# Patient Record
Sex: Male | Born: 1990 | Hispanic: No | Marital: Single | Smoking: Never smoker
Health system: Southern US, Community
[De-identification: ages and names within clinical notes are randomized; demographics above are authoritative.]

## PROBLEM LIST (undated history)

## (undated) DIAGNOSIS — K219 Gastro-esophageal reflux disease without esophagitis: Secondary | ICD-10-CM

---
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
## 2009-10-12 IMAGING — CR DG CHEST 2V
2 series · 2 of 2 positions shown · non-contrast
Comparison: None

CLINICAL DATA: Headache, chest pain fever, shortness of breath

CHEST - 2 VIEW

[w chest pa]
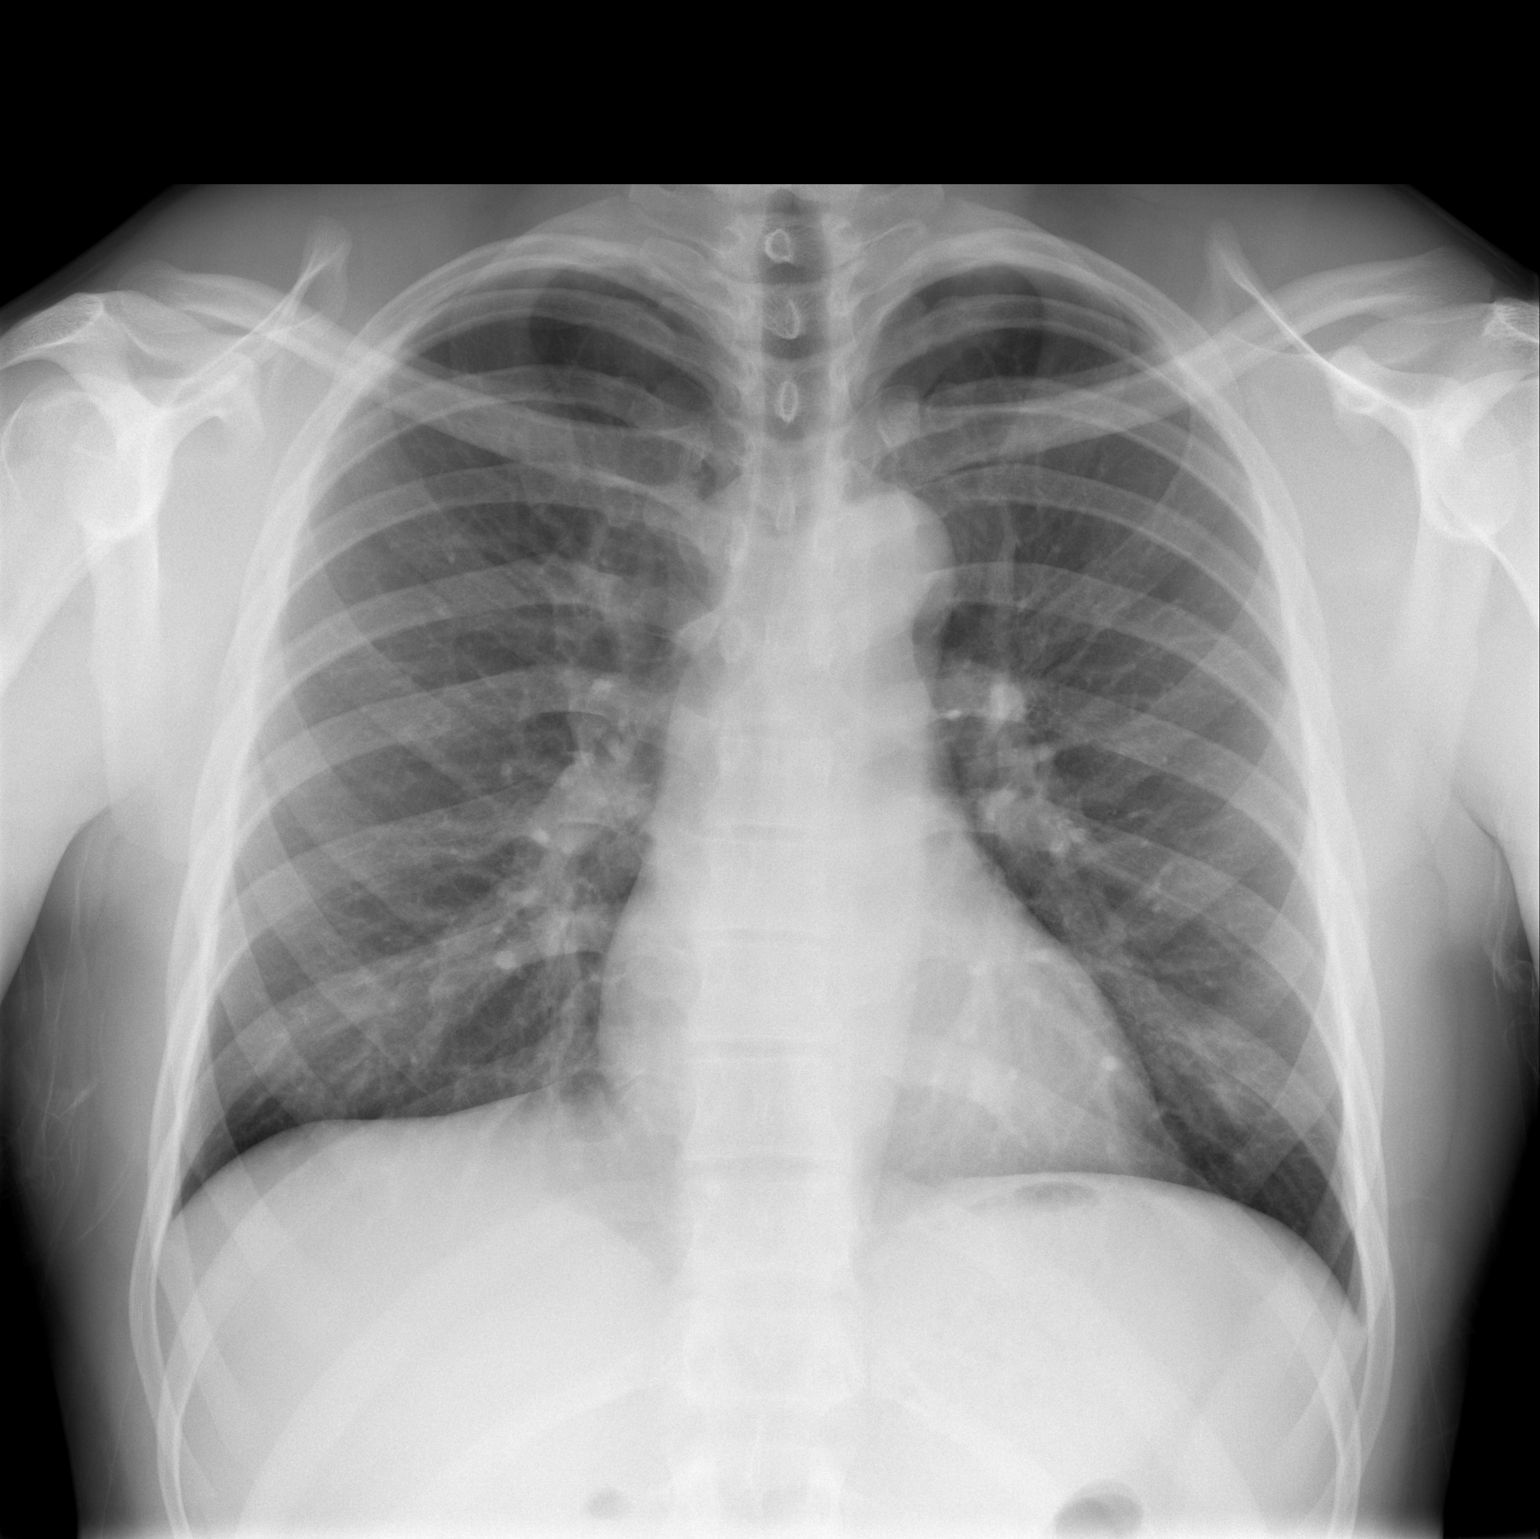

[w chest lat]
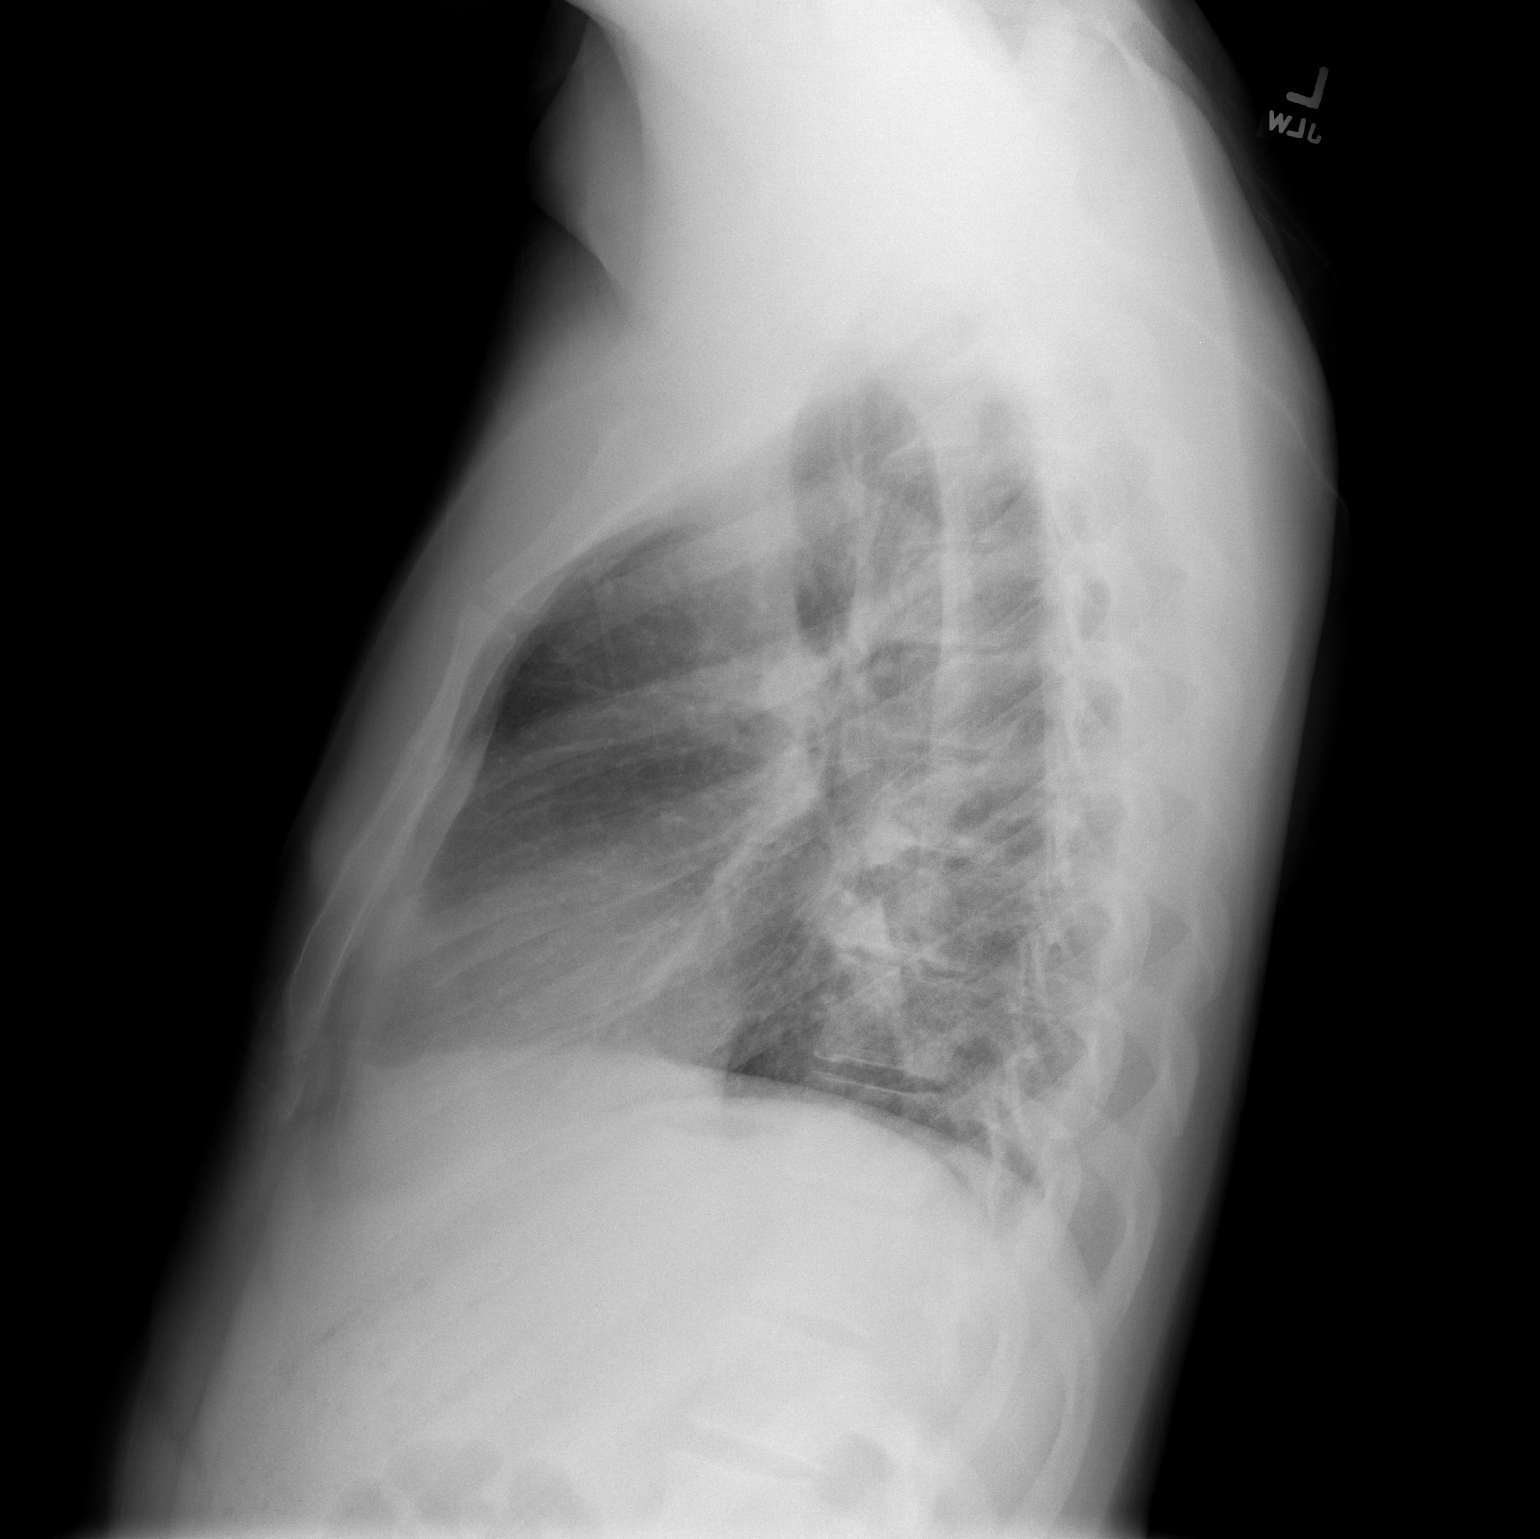

[2 of 2 positions shown; findings below may reference images not displayed]

FINDINGS: Normal heart size, mediastinal contours, and pulmonary vascularity.
Lungs clear.
No pleural effusion or pneumothorax.
Bones unremarkable.
IMPRESSION: No acute abnormalities.

## 2010-03-01 ENCOUNTER — Emergency Department (HOSPITAL_BASED_OUTPATIENT_CLINIC_OR_DEPARTMENT_OTHER)
Admission: EM | Admit: 2010-03-01 | Discharge: 2010-03-01 | Disposition: A | Discharge status: Home / Self Care | Payer: Self-pay | Attending: Emergency Medicine | Admitting: Emergency Medicine

## 2010-03-01 DIAGNOSIS — R350 Frequency of micturition: Secondary | ICD-10-CM | POA: Insufficient documentation

## 2010-03-01 LAB — URINALYSIS, ROUTINE W REFLEX MICROSCOPIC
Nitrite: NEGATIVE
Protein, ur: NEGATIVE mg/dL
Specific Gravity, Urine: 1.026 (ref 1.005–1.030)
Urobilinogen, UA: 0.2 mg/dL (ref 0.0–1.0)

## 2010-10-14 LAB — COMPREHENSIVE METABOLIC PANEL
ALT: 16
AST: 30
Albumin: 4.7
Chloride: 103
Creatinine, Ser: 0.9
GFR calc Af Amer: >60
Sodium: 141
Total Bilirubin: 0.5

## 2010-10-14 LAB — CBC
MCV: 81.3
Platelets: 146 — ABNORMAL LOW
RBC: 5.98 — ABNORMAL HIGH
WBC: 18.5 — ABNORMAL HIGH

## 2010-10-14 LAB — DIFFERENTIAL
Basophils Absolute: 0.2 — ABNORMAL HIGH
Eosinophils Relative: 0
Lymphs Abs: 1.1
Monocytes Absolute: 1.5 — ABNORMAL HIGH
Monocytes Relative: 8
Neutro Abs: 15.7 — ABNORMAL HIGH

## 2012-07-13 ENCOUNTER — Encounter (HOSPITAL_BASED_OUTPATIENT_CLINIC_OR_DEPARTMENT_OTHER): Payer: Self-pay | Admitting: Emergency Medicine

## 2012-07-13 ENCOUNTER — Emergency Department (HOSPITAL_BASED_OUTPATIENT_CLINIC_OR_DEPARTMENT_OTHER)
Admission: EM | Admit: 2012-07-13 | Discharge: 2012-07-13 | Disposition: A | Discharge status: Home / Self Care | Payer: No Typology Code available for payment source | Attending: Emergency Medicine | Admitting: Emergency Medicine

## 2012-07-13 DIAGNOSIS — R05 Cough: Secondary | ICD-10-CM | POA: Insufficient documentation

## 2012-07-13 DIAGNOSIS — J069 Acute upper respiratory infection, unspecified: Secondary | ICD-10-CM | POA: Insufficient documentation

## 2012-07-13 DIAGNOSIS — R51 Headache: Secondary | ICD-10-CM | POA: Insufficient documentation

## 2012-07-13 DIAGNOSIS — B349 Viral infection, unspecified: Secondary | ICD-10-CM

## 2012-07-13 DIAGNOSIS — R509 Fever, unspecified: Secondary | ICD-10-CM | POA: Insufficient documentation

## 2012-07-13 DIAGNOSIS — R1110 Vomiting, unspecified: Secondary | ICD-10-CM | POA: Insufficient documentation

## 2012-07-13 DIAGNOSIS — B9789 Other viral agents as the cause of diseases classified elsewhere: Secondary | ICD-10-CM | POA: Insufficient documentation

## 2012-07-13 DIAGNOSIS — R059 Cough, unspecified: Secondary | ICD-10-CM | POA: Insufficient documentation

## 2012-07-13 LAB — RAPID STREP SCREEN (MED CTR MEBANE ONLY): Streptococcus, Group A Screen (Direct): NEGATIVE

## 2012-07-13 MED ORDER — ONDANSETRON 8 MG PO TBDP
8.0000 mg | ORAL_TABLET | Freq: Once | ORAL | Status: AC
  Administered 2012-07-13: 8 mg via ORAL
  Filled 2012-07-13: qty 1

## 2012-07-13 MED ORDER — OXYMETAZOLINE HCL 0.05 % NA SOLN
1.0000 | Freq: Once | NASAL | Status: AC
  Administered 2012-07-13: 1 via NASAL
  Filled 2012-07-13: qty 15

## 2012-07-13 MED ORDER — IBUPROFEN 800 MG PO TABS
800.0000 mg | ORAL_TABLET | Freq: Once | ORAL | Status: AC
  Administered 2012-07-13: 800 mg via ORAL
  Filled 2012-07-13: qty 1

## 2012-07-13 NOTE — ED Notes (Signed)
Pt c/o flu-like symptoms including right ear pain, sore throat, vomiting and headache.

## 2012-07-14 NOTE — ED Provider Notes (Signed)
   History    CSN: [id number] Arrival date & time 07/13/12  3086  First MD Initiated Contact with Patient 07/13/12 [phone]     Chief Complaint  Patient presents with  . Emesis  . Sore Throat   (Consider location/radiation/quality/duration/timing/severity/associated sxs/prior Treatment) Patient is a 22 y.o. male presenting with vomiting and pharyngitis. The history is provided by the patient.  Emesis Severity:  Mild Timing:  Intermittent Number of daily episodes:  1 Quality:  Undigested food Able to tolerate:  Solids Progression:  Improving Chronicity:  New Recent urination:  Normal Context: post-tussive   Relieved by:  Nothing Worsened by:  Nothing tried Ineffective treatments:  None tried Associated symptoms: cough, fever, headaches, sore throat and URI   Cough:    Cough characteristics:  Non-productive   Sputum characteristics:  Nondescript   Severity:  Mild   Onset quality:  Gradual   Timing:  Sporadic   Progression:  Unchanged   Chronicity:  New Sore throat:    Severity:  Mild   Onset quality:  Gradual   Timing:  Constant   Progression:  Unchanged Sore Throat Associated symptoms include headaches.     History reviewed. No pertinent past medical history. History reviewed. No pertinent past surgical history. No family history on file. History  Substance Use Topics  . Smoking status: Never Smoker   . Smokeless tobacco: Not on file  . Alcohol Use: No    Review of Systems  HENT: Positive for sore throat.   Gastrointestinal: Positive for vomiting.  Neurological: Positive for headaches.  All other systems reviewed and are negative.    Allergies  Review of patient's allergies indicates no known allergies.  Home Medications  No current outpatient prescriptions on file. BP 123/69  Pulse 82  Temp(Src) 98.8 F (37.1 C) (Oral)  Resp 20  Ht 6\' 7"  (2.007 m)  Wt 235 lb (106.595 kg)  BMI 26.46 kg/m2  SpO2 98% Physical Exam  Nursing note and vitals  reviewed. Constitutional: He is oriented to person, place, and time. He appears well-developed and well-nourished.  HENT:  Head: Normocephalic and atraumatic.  Right Ear: External ear normal.  Left Ear: External ear normal.  Nose: Nose normal.  Mouth/Throat: Oropharynx is clear and moist.  Eyes: Conjunctivae and EOM are normal. Pupils are equal, round, and reactive to light.  Neck: Normal range of motion. Neck supple.  Cardiovascular: Normal rate, regular rhythm, normal heart sounds and intact distal pulses.   Pulmonary/Chest: Effort normal and breath sounds normal. No respiratory distress. He has no wheezes. He exhibits no tenderness.  Abdominal: Soft. Bowel sounds are normal. He exhibits no distension and no mass. There is no tenderness. There is no guarding.  Musculoskeletal: Normal range of motion.  Neurological: He is alert and oriented to person, place, and time. He has normal reflexes. He exhibits normal muscle tone. Coordination normal.  Skin: Skin is warm and dry.  Psychiatric: He has a normal mood and affect. His behavior is normal. Judgment and thought content normal.    ED Course  Procedures (including critical care time) Labs Reviewed  RAPID STREP SCREEN  CULTURE, GROUP A STREP   No results found. 1. Viral syndrome     MDM    Hilario Quarry, MD 07/14/12 2205

## 2012-07-15 LAB — CULTURE, GROUP A STREP

## 2012-12-02 ENCOUNTER — Ambulatory Visit (INDEPENDENT_AMBULATORY_CARE_PROVIDER_SITE_OTHER): Payer: PRIVATE HEALTH INSURANCE | Admitting: Physician Assistant

## 2012-12-02 VITALS — BP 112/82 | HR 66 | Temp 97.5°F | Resp 18 | Ht 77.0 in | Wt 240.0 lb

## 2012-12-02 DIAGNOSIS — Z7251 High risk heterosexual behavior: Secondary | ICD-10-CM

## 2012-12-02 DIAGNOSIS — R350 Frequency of micturition: Secondary | ICD-10-CM

## 2012-12-02 LAB — POCT URINALYSIS DIPSTICK
Nitrite, UA: NEGATIVE
Urobilinogen, UA: 0.2
pH, UA: 7

## 2012-12-02 LAB — RPR

## 2012-12-02 LAB — POCT UA - MICROSCOPIC ONLY
Casts, Ur, LPF, POC: NEGATIVE
Crystals, Ur, HPF, POC: NEGATIVE
Yeast, UA: NEGATIVE

## 2012-12-02 LAB — HIV ANTIBODY (ROUTINE TESTING W REFLEX): HIV: NONREACTIVE

## 2012-12-02 LAB — HEPATITIS C ANTIBODY: HCV Ab: NEGATIVE

## 2012-12-02 MED ORDER — CEFTRIAXONE SODIUM 1 G IJ SOLR
250.0000 mg | INTRAMUSCULAR | Status: AC
  Administered 2012-12-02: 250 mg via INTRAMUSCULAR

## 2012-12-02 MED ORDER — AZITHROMYCIN 250 MG PO TABS: ORAL_TABLET | ORAL | Status: AC

## 2012-12-02 NOTE — Progress Notes (Signed)
Patient ID: Kenneth [name] MRN: [id number], DOB: Mar 08, 1990, 22 y.o. Date of Encounter: 12/02/2012, 1:49 PM  Primary Physician: No primary provider on file.  Chief Complaint: STD screening  HPI: 22 y.o. male with history below presents for STD screening. Patient states that his ex-girlfriend recently tested positive for gonorrhea 2 weeks ago. They had been dating for about 1.5 years and recently broke up several weeks ago. They have been working on things and are trying to bet back together. They did have sexual intercourse with a condom 2 weeks ago. He complains urinary frequency, urgency, mild bilateral low back pain, abdominal fullness, and occasional clear penile discharge. Afebrile. No chills. No nausea or vomiting. No history of STDs.   Patient states that he was faithful during the relationship and during their separation. He states that his girlfriend states that she was as well. He was worked up for [employer] about 4 weeks ago and tested negative for everything except his "screening hepatitis C test." His confirmatory hepatitis C test came back as negative.   He denies any rashes, blisters, sores, or lesions. Generally healthy. Going to [employer]. Studying speech pathology. Considering graduate school. Originally from the [place]. His father still lives out there.     History reviewed. No pertinent past medical history.   Home Meds: Prior to Admission medications   Medication Sig Start Date End Date Taking? Authorizing Provider  azithromycin (ZITHROMAX) 250 MG tablet Take 4 tabs po now 12/02/12   Sondra Barges, PA-C    Allergies: No Known Allergies  History   Social History  . Marital Status: Single    Spouse Name: N/A    Number of Children: N/A  . Years of Education: N/A   Occupational History  . Not on file.   Social History Main Topics  . Smoking status: Never Smoker   . Smokeless tobacco: Not on file  . Alcohol Use: No  . Drug Use: No  . Sexual Activity: Yes   Partners: Female    Birth Control/ Protection: Condom   Other Topics Concern  . Not on file   Social History Narrative  . No narrative on file     Review of Systems: Constitutional: negative for chills, fever, or fatigue  HEENT: negative for vision changes or hearing loss Abdominal: positive for abdominal pain. negative for nausea, vomiting, or diarrhea Genitourinary: see above Dermatological: negative for rash Neurologic: negative for headache   Physical Exam: Blood pressure 112/82, pulse 66, temperature 97.5 F (36.4 C), temperature source Oral, resp. rate 18, height 6\' 5"  (1.956 m), weight 240 lb (108.863 kg), SpO2 99.00%., Body mass index is 28.45 kg/(m^2). General: Well developed, well nourished, in no acute distress. Head: Normocephalic, atraumatic, eyes without discharge, sclera non-icteric, nares are without discharge. Bilateral auditory canals clear, TM's are without perforation, pearly grey and translucent with reflective cone of light bilaterally. Oral cavity moist, posterior pharynx without exudate, erythema, peritonsillar abscess, or post nasal drip.  Neck: Supple. No thyromegaly. Full ROM. No lymphadenopathy. Lungs: Clear bilaterally to auscultation without wheezes, rales, or rhonchi. Breathing is unlabored. Heart: RRR with S1 S2. No murmurs, rubs, or gallops appreciated. Abdomen: Soft, non-tender, non-distended with normoactive bowel sounds. No hepatosplenomegaly. No rebound/guarding. No obvious abdominal masses. Palpation over the bladder creates sensation to void. Mild bilateral CVA TTP.  Genital: No lesions, blisters, sores, or chancres. No discharge from the urethra. No TTP of the penis. No testicular TTP. No hernia.   Msk:  Strength and tone normal  for age. Extremities/Skin: Warm and dry. No clubbing or cyanosis. No edema. No rashes or suspicious lesions. Neuro: Alert and oriented X 3. Moves all extremities spontaneously. Gait is normal. CNII-XII grossly in  tact. Psych:  Responds to questions appropriately with a normal affect. Mild stuttering.     Labs: Results for orders placed in visit on 12/02/12  POCT URINALYSIS DIPSTICK      Result Value Range   Color, UA yellow     Clarity, UA clear     Glucose, UA neg     Bilirubin, UA neg     Ketones, UA neg     Spec Grav, UA 1.020     Blood, UA tr-intact     pH, UA 7.0     Protein, UA neg     Urobilinogen, UA 0.2     Nitrite, UA neg     Leukocytes, UA Negative    POCT UA - MICROSCOPIC ONLY      Result Value Range   WBC, Ur, HPF, POC neg     RBC, urine, microscopic neg     Bacteria, U Microscopic neg     Mucus, UA neg     Epithelial cells, urine per micros neg     Crystals, Ur, HPF, POC neg     Casts, Ur, LPF, POC neg     Yeast, UA neg      HIV, RPR, HSVI&II, uriprobe, hepatitis B Ab, hepatitis B Ag, hepatitis C Ab, urine culture all pending  ASSESSMENT AND PLAN:  22 y.o. male with urinary frequency, STD exposure, and high risk sex -Rocephin 250 mg IM -Azithromycin 250 mg Take all 4 tabs now #4 no RF -Await labs -Safe sex practices -RTC prn  Signed, Eula Listen, PA-C [facility] [place], [place] [address] [phone] 12/02/2012 1:49 PM

## 2012-12-03 LAB — GC/CHLAMYDIA PROBE AMP
CT Probe RNA: NEGATIVE
GC Probe RNA: NEGATIVE

## 2014-03-31 ENCOUNTER — Ambulatory Visit: Payer: Self-pay | Admitting: Surgery

## 2014-03-31 NOTE — H&P (Signed)
History of Present Illness Kenneth [name]. Kenneth [name]; 03/31/2014 11:34 AM) Patient words: lipoma left side.  The patient is a 24 year old male who presents with a complaint of mass. Referred by Dr. Leighton Ruff for evaluation of palpable subcutaneous masses.   This is a 24 year old male in good health who presents with at least a year history of a palpable mass in his anterior right thigh just below his groin. There is some mild associated discomfort. The patient cannot tell whether this area has enlarged much since he first discovered it. Recently he discovered 2 other smaller subcutaneous masses on the left side of his abdomen. He is concerned about these, so he presents now to discuss excision.   The patient is in school and also works several other jobs. He gets very little sleep. 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 Other Problems Kenneth [name], Kenneth [name]; 03/31/2014 10:21 AM) Gastroesophageal Reflux Disease  Past Surgical History Kenneth [name], [name]; 03/31/2014 10:21 AM) No pertinent past surgical history  Diagnostic Studies History Kenneth [name], [name]; 03/31/2014 10:21 AM) Colonoscopy never  Allergies (Kenneth [name], [name]; 03/31/2014 10:22 AM) No Known Drug Allergies 03/31/2014  Medication History (Kenneth [name], [name]; 03/31/2014 10:22 AM) PriLOSEC OTC (20MG  Tablet DR, Oral) Active. Medications Reconciled  Social History Kenneth [name], [name]; 03/31/2014 10:21 AM) Alcohol use Occasional alcohol use.  Family History Kenneth [name], Kenneth [name]; 03/31/2014 10:21 AM) First Degree Relatives No pertinent family history     Review of Systems Kenneth [name]; 03/31/2014 10:21 AM) General Not Present- Appetite Loss, Chills, Fatigue, Fever, Night Sweats, Weight Gain and Weight Loss. Skin Not Present- Change in Wart/Mole, Dryness, Hives, Jaundice, New Lesions, Non-Healing Wounds, Rash and Ulcer. HEENT Present- Seasonal Allergies. Not Present- Earache,  Hearing Loss, Hoarseness, Nose Bleed, Oral Ulcers, Ringing in the Ears, Sinus Pain, Sore Throat, Visual Disturbances, Wears glasses/contact lenses and Yellow Eyes. Respiratory Not Present- Bloody sputum, Chronic Cough, Difficulty Breathing, Snoring and Wheezing. Cardiovascular Not Present- Chest Pain, Difficulty Breathing Lying Down, Leg Cramps, Palpitations, Rapid Heart Rate, Shortness of Breath and Swelling of Extremities. Gastrointestinal Not Present- Abdominal Pain, Bloating, Bloody Stool, Change in Bowel Habits, Chronic diarrhea, Constipation, Difficulty Swallowing, Excessive gas, Gets full quickly at meals, Hemorrhoids, Indigestion, Nausea, Rectal Pain and Vomiting. Male Genitourinary Not Present- Blood in Urine, Change in Urinary Stream, Frequency, Impotence, Nocturia, Painful Urination, Urgency and Urine Leakage. Musculoskeletal Not Present- Back Pain, Joint Pain, Joint Stiffness, Muscle Pain, Muscle Weakness and Swelling of Extremities. Neurological Not Present- Decreased Memory, Fainting, Headaches, Numbness, Seizures, Tingling, Tremor, Trouble walking and Weakness. Psychiatric Present- Anxiety. Not Present- Bipolar, Change in Sleep Pattern, Depression, Fearful and Frequent crying. Hematology Not Present- Easy Bruising, Excessive bleeding, Gland problems, HIV and Persistent Infections.  Vitals (Kenneth [name]; 03/31/2014 10:21 AM) 03/31/2014 10:21 AM Weight: 244.8 lb Height: 72in Body Surface Area: 2.38 m Body Mass Index: 33.2 kg/m Temp.: 98.107F(Temporal)  Pulse: 67 (Regular)  BP: 134/76 (Sitting, Left Arm, Standard)     Physical Exam Kenneth [name]; 03/31/2014 11:32 AM)  The physical exam findings are as follows: Note:WDWN in NAD HEENT: EOMI, sclera anicteric Neck: No masses, no thyromegaly Lungs: CTA bilaterally; normal respiratory effort CV: Regular rate and rhythm; no murmurs Abd: +bowel sounds, soft, non-tender, two sub-cm palpable subcutaneous masses -  LLQ Anterior right thigh - palpable 2 cm subcutaneous mass; no overlying skin changes; mildly tender to palpation Ext: Well-perfused; no edema; unusually sweaty palms Skin: Warm, dry; no  sign of jaundice    Assessment & Plan Kenneth [name]. 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 The surgical procedure has been discussed with the patient. Potential risks, benefits, alternative treatments, and expected outcomes have been explained. All of the patient's questions at this time have been answered. The likelihood of reaching the patient's treatment goal is good. The patient understand the proposed surgical procedure and wishes to proceed.  The other areas probably also represent subcutaneous lipomas. Rather than removing all three, we have agree to just operate on the largest area. If the pathology shows only a benign lipoma, he will feel more comfortable about leaving them alone   Kenneth [name]. Kenneth Dover, [name], [facility] Surgery  General/ Trauma Surgery  03/31/2014 11:34 AM

## 2014-04-19 ENCOUNTER — Inpatient Hospital Stay (HOSPITAL_COMMUNITY)
Admission: RE | Admit: 2014-04-19 | Discharge: 2014-04-19 | Disposition: A | Discharge status: Home / Self Care | Payer: No Typology Code available for payment source | Source: Ambulatory Visit

## 2014-04-19 NOTE — Pre-Procedure Instructions (Signed)
Kenneth [name]  04/19/2014   Your procedure is scheduled on:  04-27-2014   Thursday   Report to [facility] Admitting at 1:30 PM    Call this number if you have problems the morning of surgery: [phone]   Remember:   Do not eat food or drink liquids after midnight.    Take these medicines the morning of surgery with A SIP OF WATER: none   Do not wear jewelry,   Do not wear lotions, powders, or perfumes.   Do not shave 48 hours prior to surgery. Men may shave face and neck.   Do not bring valuables to the hospital.  [facility] is not responsible  for any belongings or valuables.               Contacts, dentures or bridgework may not be worn into surgery.                   Patients discharged the day of surgery will not be allowed to drive home.   Name and phone number of your driver:     Special Instructions: See attached sheet for instructions on CHG shower   Please read over the following fact sheets that you were given: Pain Booklet and Surgical Site Infection Prevention

## 2014-04-20 ENCOUNTER — Encounter (HOSPITAL_COMMUNITY)
Admission: RE | Admit: 2014-04-20 | Discharge: 2014-04-20 | Disposition: A | Discharge status: Home / Self Care | Payer: 59 | Source: Ambulatory Visit | Attending: Surgery | Admitting: Surgery

## 2014-04-20 ENCOUNTER — Encounter (HOSPITAL_COMMUNITY): Payer: Self-pay

## 2014-04-20 DIAGNOSIS — Z01812 Encounter for preprocedural laboratory examination: Secondary | ICD-10-CM | POA: Diagnosis not present

## 2014-04-20 HISTORY — DX: Gastro-esophageal reflux disease without esophagitis: K21.9

## 2014-04-20 LAB — CBC
HCT: 49.4 % (ref 39.0–52.0)
Hemoglobin: 17 g/dL (ref 13.0–17.0)
MCH: 27.5 pg (ref 26.0–34.0)
MCHC: 34.4 g/dL (ref 30.0–36.0)
MCV: 79.9 fL (ref 78.0–100.0)
PLATELETS: 182 10*3/uL (ref 150–400)
RBC: 6.18 MIL/uL — ABNORMAL HIGH (ref 4.22–5.81)
RDW: 13.2 % (ref 11.5–15.5)
WBC: 7.5 10*3/uL (ref 4.0–10.5)

## 2014-04-20 NOTE — Progress Notes (Signed)
No cardiac problems.   Has had NO surgeries.   DA

## 2014-04-26 MED ORDER — CHLORHEXIDINE GLUCONATE 4 % EX LIQD
1.0000 "application " | Freq: Once | CUTANEOUS | Status: DC
  Filled 2014-04-26: qty 15

## 2014-04-26 MED ORDER — CEFAZOLIN SODIUM-DEXTROSE 2-3 GM-% IV SOLR
2.0000 g | INTRAVENOUS | Status: AC
  Administered 2014-04-27: 2 g via INTRAVENOUS
  Filled 2014-04-26: qty 50

## 2014-04-27 ENCOUNTER — Ambulatory Visit (HOSPITAL_COMMUNITY): Payer: 59 | Admitting: Anesthesiology

## 2014-04-27 ENCOUNTER — Ambulatory Visit (HOSPITAL_COMMUNITY)
Admission: RE | Admit: 2014-04-27 | Discharge: 2014-04-27 | Disposition: A | Discharge status: Home / Self Care | Payer: 59 | Source: Ambulatory Visit | Attending: Surgery | Admitting: Surgery

## 2014-04-27 ENCOUNTER — Encounter (HOSPITAL_COMMUNITY)
Admission: RE | Disposition: A | Discharge status: Home / Self Care | Payer: Self-pay | Source: Ambulatory Visit | Attending: Surgery

## 2014-04-27 ENCOUNTER — Encounter (HOSPITAL_COMMUNITY): Payer: Self-pay | Admitting: Surgery

## 2014-04-27 DIAGNOSIS — Z79899 Other long term (current) drug therapy: Secondary | ICD-10-CM | POA: Insufficient documentation

## 2014-04-27 DIAGNOSIS — D1723 Benign lipomatous neoplasm of skin and subcutaneous tissue of right leg: Secondary | ICD-10-CM | POA: Diagnosis present

## 2014-04-27 DIAGNOSIS — K219 Gastro-esophageal reflux disease without esophagitis: Secondary | ICD-10-CM | POA: Insufficient documentation

## 2014-04-27 HISTORY — PX: LIPOMA EXCISION: SHX5283

## 2014-04-27 SURGERY — EXCISION LIPOMA
Anesthesia: General | Site: Leg Lower | Laterality: Right

## 2014-04-27 MED ORDER — FENTANYL CITRATE 0.05 MG/ML IJ SOLN
INTRAMUSCULAR | Status: DC | PRN
  Administered 2014-04-27: 25 ug via INTRAVENOUS
  Administered 2014-04-27: 50 ug via INTRAVENOUS
  Administered 2014-04-27: 25 ug via INTRAVENOUS

## 2014-04-27 MED ORDER — HYDROCODONE-ACETAMINOPHEN 5-325 MG PO TABS: 1.0000 | ORAL_TABLET | ORAL | Status: DC | PRN

## 2014-04-27 MED ORDER — LACTATED RINGERS IV SOLN
INTRAVENOUS | Status: DC
  Administered 2014-04-27: 12:00:00 via INTRAVENOUS

## 2014-04-27 MED ORDER — MIDAZOLAM HCL 5 MG/5ML IJ SOLN
INTRAMUSCULAR | Status: DC | PRN
  Administered 2014-04-27 (×2): 1 mg via INTRAVENOUS

## 2014-04-27 MED ORDER — ONDANSETRON HCL 4 MG/2ML IJ SOLN: 4.0000 mg | INTRAMUSCULAR | Status: DC | PRN

## 2014-04-27 MED ORDER — FENTANYL CITRATE 0.05 MG/ML IJ SOLN
INTRAMUSCULAR | Status: AC
Start: 2014-04-27 — End: 2014-04-27
  Filled 2014-04-27: qty 5

## 2014-04-27 MED ORDER — LIDOCAINE HCL (CARDIAC) 20 MG/ML IV SOLN
INTRAVENOUS | Status: DC | PRN
  Administered 2014-04-27: 80 mg via INTRAVENOUS

## 2014-04-27 MED ORDER — LIDOCAINE HCL (CARDIAC) 20 MG/ML IV SOLN
INTRAVENOUS | Status: AC
  Filled 2014-04-27: qty 5

## 2014-04-27 MED ORDER — MIDAZOLAM HCL 2 MG/2ML IJ SOLN
INTRAMUSCULAR | Status: AC
  Filled 2014-04-27: qty 2

## 2014-04-27 MED ORDER — BUPIVACAINE-EPINEPHRINE 0.25% -1:200000 IJ SOLN
INTRAMUSCULAR | Status: DC | PRN
Start: 2014-04-27 — End: 2014-04-27
  Administered 2014-04-27: 10 mL

## 2014-04-27 MED ORDER — PROPOFOL 10 MG/ML IV BOLUS
INTRAVENOUS | Status: AC
  Filled 2014-04-27: qty 20

## 2014-04-27 MED ORDER — BUPIVACAINE-EPINEPHRINE (PF) 0.25% -1:200000 IJ SOLN
INTRAMUSCULAR | Status: AC
  Filled 2014-04-27: qty 30

## 2014-04-27 MED ORDER — PROPOFOL 10 MG/ML IV BOLUS
INTRAVENOUS | Status: DC | PRN
  Administered 2014-04-27: 200 mg via INTRAVENOUS

## 2014-04-27 MED ORDER — LACTATED RINGERS IV SOLN
INTRAVENOUS | Status: DC | PRN
  Administered 2014-04-27: 12:00:00 via INTRAVENOUS

## 2014-04-27 MED ORDER — HYDROCODONE-ACETAMINOPHEN 5-325 MG PO TABS: 1.0000 | ORAL_TABLET | ORAL | Status: AC | PRN

## 2014-04-27 MED ORDER — ONDANSETRON HCL 4 MG/2ML IJ SOLN
INTRAMUSCULAR | Status: DC | PRN
  Administered 2014-04-27: 4 mg via INTRAVENOUS

## 2014-04-27 MED ORDER — MORPHINE SULFATE 2 MG/ML IJ SOLN: 2.0000 mg | INTRAMUSCULAR | Status: DC | PRN

## 2014-04-27 MED ORDER — 0.9 % SODIUM CHLORIDE (POUR BTL) OPTIME
TOPICAL | Status: DC | PRN
  Administered 2014-04-27: 1000 mL

## 2014-04-27 SURGICAL SUPPLY — 41 items
BENZOIN TINCTURE PRP APPL 2/3 (GAUZE/BANDAGES/DRESSINGS) ×3 IMPLANT
BLADE SURG 10 STRL SS (BLADE) ×3 IMPLANT
BLADE SURG 15 STRL LF DISP TIS (BLADE) ×1 IMPLANT
BLADE SURG 15 STRL SS (BLADE) ×2
BLADE SURG ROTATE 9660 (MISCELLANEOUS) IMPLANT
CHLORAPREP W/TINT 26ML (MISCELLANEOUS) ×3 IMPLANT
CLOSURE WOUND 1/2 X4 (GAUZE/BANDAGES/DRESSINGS) ×1
COVER SURGICAL LIGHT HANDLE (MISCELLANEOUS) ×3 IMPLANT
DRAPE LAPAROTOMY T 98X78 PEDS (DRAPES) ×3 IMPLANT
DRAPE UTILITY XL STRL (DRAPES) ×3 IMPLANT
DRSG TEGADERM 4X4.75 (GAUZE/BANDAGES/DRESSINGS) ×3 IMPLANT
ELECT CAUTERY BLADE 6.4 (BLADE) ×3 IMPLANT
ELECT REM PT RETURN 9FT ADLT (ELECTROSURGICAL) ×3
ELECTRODE REM PT RTRN 9FT ADLT (ELECTROSURGICAL) ×1 IMPLANT
GAUZE SPONGE 2X2 8PLY STRL LF (GAUZE/BANDAGES/DRESSINGS) ×1 IMPLANT
GAUZE SPONGE 4X4 12PLY STRL (GAUZE/BANDAGES/DRESSINGS) ×3 IMPLANT
GLOVE BIO SURGEON STRL SZ7 (GLOVE) ×3 IMPLANT
GLOVE BIOGEL PI IND STRL 7.5 (GLOVE) ×1 IMPLANT
GLOVE BIOGEL PI INDICATOR 7.5 (GLOVE) ×2
GOWN STRL REUS W/ TWL LRG LVL3 (GOWN DISPOSABLE) ×2 IMPLANT
GOWN STRL REUS W/TWL LRG LVL3 (GOWN DISPOSABLE) ×4
KIT BASIN OR (CUSTOM PROCEDURE TRAY) ×3 IMPLANT
KIT ROOM TURNOVER OR (KITS) ×3 IMPLANT
NEEDLE HYPO 25GX1X1/2 BEV (NEEDLE) ×3 IMPLANT
NS IRRIG 1000ML POUR BTL (IV SOLUTION) ×3 IMPLANT
PACK SURGICAL SETUP 50X90 (CUSTOM PROCEDURE TRAY) ×3 IMPLANT
PAD ARMBOARD 7.5X6 YLW CONV (MISCELLANEOUS) ×3 IMPLANT
PENCIL BUTTON HOLSTER BLD 10FT (ELECTRODE) ×3 IMPLANT
SPECIMEN JAR SMALL (MISCELLANEOUS) ×3 IMPLANT
SPONGE GAUZE 2X2 STER 10/PKG (GAUZE/BANDAGES/DRESSINGS) ×2
SPONGE LAP 18X18 X RAY DECT (DISPOSABLE) ×3 IMPLANT
STRIP CLOSURE SKIN 1/2X4 (GAUZE/BANDAGES/DRESSINGS) ×2 IMPLANT
SUT MNCRL AB 4-0 PS2 18 (SUTURE) ×3 IMPLANT
SUT VIC AB 2-0 SH 27 (SUTURE)
SUT VIC AB 2-0 SH 27X BRD (SUTURE) IMPLANT
SUT VIC AB 3-0 SH 27 (SUTURE) ×2
SUT VIC AB 3-0 SH 27XBRD (SUTURE) ×1 IMPLANT
SYR BULB 3OZ (MISCELLANEOUS) ×3 IMPLANT
SYR CONTROL 10ML LL (SYRINGE) ×3 IMPLANT
TOWEL OR 17X24 6PK STRL BLUE (TOWEL DISPOSABLE) ×3 IMPLANT
TOWEL OR 17X26 10 PK STRL BLUE (TOWEL DISPOSABLE) ×3 IMPLANT

## 2014-04-27 NOTE — Anesthesia Procedure Notes (Signed)
Procedure Name: LMA Insertion Date/Time: 04/27/2014 12:07 PM Performed by: Susa Loffler Pre-anesthesia Checklist: Patient identified, Emergency Drugs available, Suction available, Patient being monitored and Timeout performed Patient Re-evaluated:Patient Re-evaluated prior to inductionOxygen Delivery Method: Circle system utilized Preoxygenation: Pre-oxygenation with 100% oxygen Intubation Type: IV induction LMA: LMA inserted LMA Size: 5.0 Number of attempts: 1 Placement Confirmation: positive ETCO2 and breath sounds checked- equal and bilateral Tube secured with: Tape Dental Injury: Teeth and Oropharynx as per pre-operative assessment

## 2014-04-27 NOTE — Op Note (Signed)
Pre-op Diagnosis:  Subcutaneous lipoma - right thigh 3 cm Post-op Diagnosis:  Same Procedure:  Excision of subcutaneous lipoma - right thigh 3 cm Surgeon:  Donnie Mesa K. Anesthesia:  Gen - LMA  Indications:  24 yo male presents with a firm mass enlarging in his lateral anterior right thigh.  He presents now for excision.  Procedure:  The patient was brought to the recovery room and placed in a supine position on the table.  After an adequate level of anesthesia was obtained, his right thigh was prepped with chloraprep and draped in sterile fashion.  Timeout was taken.  The area over the palpable mass was infiltrated with 0.25% Marcaine. A vertical incision was made and dissection was carried down to the surface of the mass.  I bluntly dissected around the mass and it was completely removed.  Hemostasis was obtained with cautery.  The wound was closed with 3-0 vicryl and 4-0 monocryl.  Steri-strips and dressings were applied.  He was extubated and brought to the recovery room in stable condition.  Counts were correct.  Imogene Burn. Georgette Dover, MD, [facility] Surgery  General/ Trauma Surgery  04/27/2014 12:31 PM

## 2014-04-27 NOTE — Anesthesia Postprocedure Evaluation (Signed)
  Anesthesia Post-op Note  Patient: Kenneth [name]  Procedure(s) Performed: Procedure(s): EXCISION OF LIPOMA RIGHT ANTERIOR THIGH (Right)  Patient Location: PACU  Anesthesia Type:General  Level of Consciousness: awake, alert  and oriented  Airway and Oxygen Therapy: Patient Spontanous Breathing  Post-op Pain: none  Post-op Assessment: Post-op Vital signs reviewed, Patient's Cardiovascular Status Stable, Respiratory Function Stable, Patent Airway, No signs of Nausea or vomiting and Pain level controlled  Post-op Vital Signs: Reviewed and stable  Last Vitals:  Filed Vitals:   04/27/14 1352  BP: 122/81  Pulse: 47  Temp:   Resp:     Complications: No apparent anesthesia complications

## 2014-04-27 NOTE — H&P (View-Only) (Signed)
History of Present Illness Kenneth [name]. Kenneth Friesenhahn MD; 03/31/2014 11:34 AM) Patient words: lipoma left side.  The patient is a 24 year old male who presents with a complaint of mass. Referred by Dr. Leighton Ruff for evaluation of palpable subcutaneous masses.   This is a 24 year old male in good health who presents with at least a year history of a palpable mass in his anterior right thigh just below his groin. There is some mild associated discomfort. The patient cannot tell whether this area has enlarged much since he first discovered it. Recently he discovered 2 other smaller subcutaneous masses on the left side of his abdomen. He is concerned about these, so he presents now to discuss excision.   The patient is in school and also works several other jobs. He gets very little sleep. 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 Other Problems Kenneth [name], Kenneth [name]; 03/31/2014 10:21 AM) Gastroesophageal Reflux Disease  Past Surgical History Kenneth [name], CMA; 03/31/2014 10:21 AM) No pertinent past surgical history  Diagnostic Studies History Kenneth [name], CMA; 03/31/2014 10:21 AM) Colonoscopy never  Allergies Kenneth [name], CMA; 03/31/2014 10:22 AM) No Known Drug Allergies 03/31/2014  Medication History (Kenneth [name], CMA; 03/31/2014 10:22 AM) PriLOSEC OTC (20MG  Tablet DR, Oral) Active. Medications Reconciled  Social History Kenneth [name], CMA; 03/31/2014 10:21 AM) Alcohol use Occasional alcohol use.  Family History Kenneth [name], [place]; 03/31/2014 10:21 AM) First Degree Relatives No pertinent family history     Review of Systems Kenneth [name] CMA; 03/31/2014 10:21 AM) General Not Present- Appetite Loss, Chills, Fatigue, Fever, Night Sweats, Weight Gain and Weight Loss. Skin Not Present- Change in Wart/Mole, Dryness, Hives, Jaundice, New Lesions, Non-Healing Wounds, Rash and Ulcer. HEENT Present- Seasonal Allergies. Not Present- Earache,  Hearing Loss, Hoarseness, Nose Bleed, Oral Ulcers, Ringing in the Ears, Sinus Pain, Sore Throat, Visual Disturbances, Wears glasses/contact lenses and Yellow Eyes. Respiratory Not Present- Bloody sputum, Chronic Cough, Difficulty Breathing, Snoring and Wheezing. Cardiovascular Not Present- Chest Pain, Difficulty Breathing Lying Down, Leg Cramps, Palpitations, Rapid Heart Rate, Shortness of Breath and Swelling of Extremities. Gastrointestinal Not Present- Abdominal Pain, Bloating, Bloody Stool, Change in Bowel Habits, Chronic diarrhea, Constipation, Difficulty Swallowing, Excessive gas, Gets full quickly at meals, Hemorrhoids, Indigestion, Nausea, Rectal Pain and Vomiting. Male Genitourinary Not Present- Blood in Urine, Change in Urinary Stream, Frequency, Impotence, Nocturia, Painful Urination, Urgency and Urine Leakage. Musculoskeletal Not Present- Back Pain, Joint Pain, Joint Stiffness, Muscle Pain, Muscle Weakness and Swelling of Extremities. Neurological Not Present- Decreased Memory, Fainting, Headaches, Numbness, Seizures, Tingling, Tremor, Trouble walking and Weakness. Psychiatric Present- Anxiety. Not Present- Bipolar, Change in Sleep Pattern, Depression, Fearful and Frequent crying. Hematology Not Present- Easy Bruising, Excessive bleeding, Gland problems, HIV and Persistent Infections.  Vitals (Kenneth [name] CMA; 03/31/2014 10:21 AM) 03/31/2014 10:21 AM Weight: 244.8 lb Height: 72in Body Surface Area: 2.38 m Body Mass Index: 33.2 kg/m Temp.: 98.65F(Temporal)  Pulse: 67 (Regular)  BP: 134/76 (Sitting, Left Arm, Standard)     Physical Exam Kenneth Key K. Ashok Sawaya MD; 03/31/2014 11:32 AM)  The physical exam findings are as follows: Note:WDWN in NAD HEENT: EOMI, sclera anicteric Neck: No masses, no thyromegaly Lungs: CTA bilaterally; normal respiratory effort CV: Regular rate and rhythm; no murmurs Abd: +bowel sounds, soft, non-tender, two sub-cm palpable subcutaneous masses -  LLQ Anterior right thigh - palpable 2 cm subcutaneous mass; no overlying skin changes; mildly tender to palpation Ext: Well-perfused; no edema; unusually sweaty palms Skin: Warm, dry; no  sign of jaundice    Assessment & Plan Kenneth [name]. 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 The surgical procedure has been discussed with the patient. Potential risks, benefits, alternative treatments, and expected outcomes have been explained. All of the patient's questions at this time have been answered. The likelihood of reaching the patient's treatment goal is good. The patient understand the proposed surgical procedure and wishes to proceed.  The other areas probably also represent subcutaneous lipomas. Rather than removing all three, we have agree to just operate on the largest area. If the pathology shows only a benign lipoma, he will feel more comfortable about leaving them alone   Kenneth [name]. Kenneth Dover, MD, [facility] Surgery  General/ Trauma Surgery  03/31/2014 11:34 AM

## 2014-04-27 NOTE — Interval H&P Note (Signed)
History and Physical Interval Note:  04/27/2014 11:36 AM  Kenneth [name]  has presented today for surgery, with the diagnosis of Lipoma Right Thigh  The various methods of treatment have been discussed with the patient and family. After consideration of risks, benefits and other options for treatment, the patient has consented to  Procedure(s): EXCISION OF LIPOMA RIGHT ANTERIOR THIGH (Right) as a surgical intervention .  The patient's history has been reviewed, patient examined, no change in status, stable for surgery.  I have reviewed the patient's chart and labs.  Questions were answered to the patient's satisfaction.     Peja Allender K.

## 2014-04-27 NOTE — Transfer of Care (Signed)
Immediate Anesthesia Transfer of Care Note  Patient: Kenneth [name]  Procedure(s) Performed: Procedure(s): EXCISION OF LIPOMA RIGHT ANTERIOR THIGH (Right)  Patient Location: PACU  Anesthesia Type:General  Level of Consciousness: awake, alert  and oriented  Airway & Oxygen Therapy: Patient Spontanous Breathing and Patient connected to nasal cannula oxygen  Post-op Assessment: Report given to RN and Post -op Vital signs reviewed and stable  Post vital signs: Reviewed and stable  Last Vitals:  Filed Vitals:   04/27/14 1245  BP:   Pulse:   Temp: 36.4 C  Resp:     Complications: No apparent anesthesia complications

## 2014-04-27 NOTE — Anesthesia Preprocedure Evaluation (Signed)
Anesthesia Evaluation  Patient identified by MRN, date of birth, ID band Patient awake    Reviewed: Allergy & Precautions, NPO status , Patient's Chart, lab work & pertinent test results  History of Anesthesia Complications (+) AWARENESS UNDER ANESTHESIANegative for: history of anesthetic complications  Airway Mallampati: II  TM Distance: >3 FB Neck ROM: Full    Dental  (+) Teeth Intact   Pulmonary neg pulmonary ROS,  breath sounds clear to auscultation        Cardiovascular negative cardio ROS  Rhythm:Regular     Neuro/Psych negative neurological ROS     GI/Hepatic Neg liver ROS, GERD-  Medicated and Controlled,  Endo/Other  negative endocrine ROS  Renal/GU negative Renal ROS     Musculoskeletal negative musculoskeletal ROS (+)   Abdominal   Peds  Hematology negative hematology ROS (+)   Anesthesia Other Findings   Reproductive/Obstetrics                             Anesthesia Physical Anesthesia Plan  ASA: II  Anesthesia Plan: General   Post-op Pain Management:    Induction: Intravenous  Airway Management Planned: LMA  Additional Equipment: None  Intra-op Plan:   Post-operative Plan: Extubation in OR  Informed Consent: I have reviewed the patients History and Physical, chart, labs and discussed the procedure including the risks, benefits and alternatives for the proposed anesthesia with the patient or authorized representative who has indicated his/her understanding and acceptance.   Dental advisory given  Plan Discussed with: CRNA and Surgeon  Anesthesia Plan Comments:         Anesthesia Quick Evaluation

## 2014-04-27 NOTE — Discharge Instructions (Signed)
Ice pack as needed Remove the outer dressing on Saturday, then you may shower over the steri-strips.  They will come off in 1-2 weeks. Regular activity

## 2014-05-01 ENCOUNTER — Encounter (HOSPITAL_COMMUNITY): Payer: Self-pay | Admitting: Surgery
